# Patient Record
Sex: Male | Born: 1995 | Race: White | Hispanic: No | Marital: Single | Smoking: Never smoker
Health system: Southern US, Community
[De-identification: ages and names within clinical notes are randomized; demographics above are authoritative.]

---

## 2017-07-30 DIAGNOSIS — S0101XA Laceration without foreign body of scalp, initial encounter: Secondary | ICD-10-CM | POA: Diagnosis not present

## 2017-08-09 DIAGNOSIS — Z4802 Encounter for removal of sutures: Secondary | ICD-10-CM | POA: Diagnosis not present

## 2017-12-05 ENCOUNTER — Ambulatory Visit (INDEPENDENT_AMBULATORY_CARE_PROVIDER_SITE_OTHER): Payer: BLUE CROSS/BLUE SHIELD | Admitting: Medical

## 2017-12-05 ENCOUNTER — Encounter: Payer: Self-pay | Admitting: Medical

## 2017-12-05 ENCOUNTER — Ambulatory Visit: Payer: Self-pay

## 2017-12-05 VITALS — BP 135/80 | HR 56 | Temp 98.1°F | Resp 16 | Ht 74.0 in | Wt 168.6 lb

## 2017-12-05 DIAGNOSIS — R42 Dizziness and giddiness: Secondary | ICD-10-CM | POA: Diagnosis not present

## 2017-12-05 MED ORDER — MECLIZINE HCL 12.5 MG PO TABS: 12.5000 mg | ORAL_TABLET | Freq: Three times a day (TID) | ORAL | 0 refills | Status: AC | PRN

## 2017-12-05 MED ORDER — FLUTICASONE PROPIONATE 50 MCG/ACT NA SUSP: 2.0000 | Freq: Every day | NASAL | 1 refills | Status: AC

## 2017-12-05 NOTE — Progress Notes (Signed)
Subjective:    Patient ID: Danny Allen, male    DOB: 10-13-1995, 22 y.o.   MRN: [id number]  HPI Pt in to see me today. New pt but will see Dr. Carmelia Roller on Oct 7,2019.  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  Pt states he recently flew to [place]. He slept very poorly for one day and first day felt really dizzy. Then next 4 days he felt like he needed to almost hold on to things.He mentions that turning his head about one week ago dizziness was worse. Pt states about midway through his trip he was given a nose spray and he thinks this may have helped. Pt feels better than he did at beginning. He slept poorly for about 10 days. About 4-5 hours each night.  Pt does have pressure in his ears.   Pt use spray he got in [place] for 4 hours.    Review of Systems  Constitutional: Negative for chills, fatigue and fever.  HENT: Negative for congestion, dental problem, facial swelling, mouth sores, postnasal drip, sinus pressure and sinus pain.        Ear pressure.  Respiratory: Negative for cough, chest tightness, shortness of breath and wheezing.   Cardiovascular: Negative for chest pain and palpitations.  Gastrointestinal: Negative for abdominal pain.  Genitourinary: Negative for difficulty urinating and dysuria.  Musculoskeletal: Negative for back pain.  Skin: Negative for rash.  Neurological: Positive for dizziness. Negative for speech difficulty, weakness, light-headedness, numbness and headaches.  Hematological: Negative for adenopathy. Does not bruise/bleed easily.  Psychiatric/Behavioral: Negative for agitation, confusion and suicidal ideas. The patient is not nervous/anxious.     No past medical history on file.   Social History   Socioeconomic History  . Marital status: Single    Spouse name: Not on file  . Number of children: Not on file  . Years of education: Not on file  . Highest education level: Not on file    Occupational History  . Not on file  Social Needs  . Financial resource strain: Not on file  . Food insecurity:    Worry: Not on file    Inability: Not on file  . Transportation needs:    Medical: Not on file    Non-medical: Not on file  Tobacco Use  . Smoking status: Never Smoker  . Smokeless tobacco: Never Used  Substance and Sexual Activity  . Alcohol use: Not on file  . Drug use: Not on file  . Sexual activity: Not on file  Lifestyle  . Physical activity:    Days per week: Not on file    Minutes per session: Not on file  . Stress: Not on file  Relationships  . Social connections:    Talks on phone: Not on file    Gets together: Not on file    Attends religious service: Not on file    Active member of club or organization: Not on file    Attends meetings of clubs or organizations: Not on file    Relationship status: Not on file  . Intimate partner violence:    Fear of current or ex partner: Not on file    Emotionally abused: Not on file    Physically abused: Not on file    Forced sexual activity: Not on file  Other Topics Concern  . Not on file  Social History Narrative  . Not on file    No family history on file.  Not on File  No current outpatient medications on file prior to visit.   No current facility-administered medications on file prior to visit.     BP 126/69   Pulse (!) 53   Temp 98.1 F (36.7 C) (Oral)   Resp 16   Ht 6\' 2"  (1.88 m)   Wt 168 lb 9.6 oz (76.5 kg)   SpO2 100%   BMI 21.65 kg/m       Objective:   Physical Exam  General Mental Status- Alert. General Appearance- Not in acute distress.   Skin General: Color- Normal Color. Moisture- Normal Moisture.  Neck Carotid Arteries- Normal color. Moisture- Normal Moisture. No carotid bruits. No JVD.  Chest and Lung Exam Auscultation: Breath Sounds:-Normal.  Cardiovascular Auscultation:Rythm- Regular. Murmurs & Other Heart Sounds:Auscultation of the heart reveals- No  Murmurs.  Abdomen Inspection:-Inspeection Normal. Palpation/Percussion:Note:No mass. Palpation and Percussion of the abdomen reveal- Non Tender, Non Distended + BS, no rebound or guarding.  Neurologic Cranial Nerve exam:- CN III-XII intact(No nystagmus), symmetric smile. Drift Test:- No drift. Romberg Exam:- Negative.  Heal to Toe Gait exam:-Normal. Finger to Nose:- Normal/Intact Strength:- 5/5 equal and symmetric strength both upper and lower extremities. When turns head to the rt feels mild dizzy    HEENT Head- Normal. Ear Auditory Canal - Left- Normal. Right - Normal.Tympanic Membrane- Left- Normal. Right- Normal. Eye Sclera/Conjunctiva- Left- Normal. Right- Normal. Nose & Sinuses Nasal Mucosa- Left-  Boggy and Congested. Right-  Boggy and  Congested.Bilateral no maxillary and no frontal sinus pressure. Mouth & Throat Lips: Upper Lip- Normal: no dryness, cracking, pallor, cyanosis, or vesicular eruption. Lower Lip-Normal: no dryness, cracking, pallor, cyanosis or vesicular eruption. Buccal Mucosa- Bilateral- No Aphthous ulcers. Oropharynx- No Discharge or Erythema. Tonsils: Characteristics- Bilateral- No Erythema or Congestion. Size/Enlargement- Bilateral- No enlargement. Discharge- bilateral-None.      Assessment & Plan:  For your recent dizziness with a recent air travel and some described ear pressure, I prescribed Flonase nasal spray and meclizine.  Rx advisement given.  The nasal spray that they gave you in [place] was probably equivalent of Afrin.  Recommended to stop that to avoid rebound nasal congestion.  On exam you had minimal slight dizziness with turning her head.  If you do start this feel dramatic dizziness or vertigo with head position changes then you could do Epley maneuvers.  You could find instruction video online.  For history of ITP and recent dizziness, I ordered a CBC and metabolic panel today.  If you get dizziness with gross motor or sensory function  deficit type symptoms then recommend ED evaluation.  Follow-up as regular scheduled with Dr. Carmelia Roller.  But if you could my chart me update on how you are doing with above medications by this coming Monday.  Also recommend that you get plenty of sleep and keep yourself hydrated.  Esperanza Richters, PA-C

## 2017-12-05 NOTE — Patient Instructions (Addendum)
For your recent dizziness with a recent air travel and some described ear pressure, I prescribed Flonase nasal spray and meclizine.  Rx advisement given.  The nasal spray that they gave you in [place] was probably equivalent of Afrin.  Recommended to stop that to avoid rebound nasal congestion.  On exam you had minimal slight dizziness with turning her head.  If you do start this feel dramatic dizziness or vertigo with head position changes then you could do Epley maneuvers.  You could find instruction video online.  For history of ITP and recent dizziness, I ordered a CBC and metabolic panel today.  If you get dizziness with gross motor or sensory function deficit type symptoms then recommend ED evaluation.  Follow-up as regular scheduled with Dr. Carmelia Roller.  But if you could my chart me update on how you are doing with above medications by this coming Monday.  Also recommend that you get plenty of sleep and keep yourself hydrated.

## 2017-12-05 NOTE — Telephone Encounter (Signed)
Pt called with C/O dizziness (like standing on a boat) since last Monday 9/16. States he has been traveling with business and his right ear feels funny. He states that when in [place] a pharmacist gave him nose gtts for his dizziness. "It helped some" Pt has seasonal allergies. Pt denies headache, nausea, weakness. Appointment made per protocol. Care advice read to patient Pt verbalized understanding of all.  Reason for Disposition . [1] MILD dizziness (e.g., vertigo; walking normally) AND [2] has NOT been evaluated by physician for this  Answer Assessment - Initial Assessment Questions 1. DESCRIPTION: "Describe your dizziness."     Like standing on a boat 2. VERTIGO: "Do you feel like either you or the room is spinning or tilting?"      tilting 3. LIGHTHEADED: "Do you feel lightheaded?" (e.g., somewhat faint, woozy, weak upon standing)     yes 4. SEVERITY: "How bad is it?"  "Can you walk?"   - MILD - Feels unsteady but walking normally.   - MODERATE - Feels very unsteady when walking, but not falling; interferes with normal activities (e.g., school, work) .   - SEVERE - Unable to walk without falling (requires assistance).     mild 5. ONSET:  "When did the dizziness begin?"     9/16 6. AGGRAVATING FACTORS: "Does anything make it worse?" (e.g., standing, change in head position)     Change in head position and standing 7. CAUSE: "What do you think is causing the dizziness?"     Plane flight strange felling rt ear 8. RECURRENT SYMPTOM: "Have you had dizziness before?" If so, ask: "When was the last time?" "What happened that time?"     no 9. OTHER SYMPTOMS: "Do you have any other symptoms?" (e.g., headache, weakness, numbness, vomiting, earache)     no 10. PREGNANCY: "Is there any chance you are pregnant?" "When was your last menstrual period?"       N/A  Protocols used: DIZZINESS - VERTIGO-A-AH

## 2017-12-06 LAB — COMPREHENSIVE METABOLIC PANEL
ALK PHOS: 52 U/L (ref 39–117)
ALT: 13 U/L (ref 0–53)
AST: 12 U/L (ref 0–37)
Albumin: 5 g/dL (ref 3.5–5.2)
BILIRUBIN TOTAL: 0.7 mg/dL (ref 0.2–1.2)
BUN: 13 mg/dL (ref 6–23)
CO2: 32 meq/L (ref 19–32)
Calcium: 9.8 mg/dL (ref 8.4–10.5)
Chloride: 100 mEq/L (ref 96–112)
Creatinine, Ser: 1.05 mg/dL (ref 0.40–1.50)
GFR: 91.99 mL/min (ref >60.00)
GLUCOSE: 74 mg/dL (ref 70–99)
POTASSIUM: 3.8 meq/L (ref 3.5–5.1)
SODIUM: 140 meq/L (ref 135–145)
TOTAL PROTEIN: 7.1 g/dL (ref 6.0–8.3)

## 2017-12-06 LAB — CBC WITH DIFFERENTIAL/PLATELET
BASOS ABS: 0 10*3/uL (ref 0.0–0.1)
Basophils Relative: 0.5 % (ref 0.0–3.0)
Eosinophils Absolute: 0.3 10*3/uL (ref 0.0–0.7)
Eosinophils Relative: 4.4 % (ref 0.0–5.0)
HCT: 43.7 % (ref 39.0–52.0)
Hemoglobin: 14.7 g/dL (ref 13.0–17.0)
LYMPHS ABS: 3.1 10*3/uL (ref 0.7–4.0)
Lymphocytes Relative: 48.7 % — ABNORMAL HIGH (ref 12.0–46.0)
MCHC: 33.6 g/dL (ref 30.0–36.0)
MCV: 86.7 fl (ref 78.0–100.0)
MONO ABS: 0.5 10*3/uL (ref 0.1–1.0)
Monocytes Relative: 8.2 % (ref 3.0–12.0)
NEUTROS PCT: 38.2 % — AB (ref 43.0–77.0)
Neutro Abs: 2.4 10*3/uL (ref 1.4–7.7)
PLATELETS: 196 10*3/uL (ref 150.0–400.0)
RBC: 5.04 Mil/uL (ref 4.22–5.81)
RDW: 12.9 % (ref 11.5–15.5)
WBC: 6.4 10*3/uL (ref 4.0–10.5)

## 2017-12-16 ENCOUNTER — Ambulatory Visit: Payer: BLUE CROSS/BLUE SHIELD | Admitting: Family Medicine

## 2018-01-01 ENCOUNTER — Telehealth: Payer: Self-pay | Admitting: Medical

## 2018-01-01 NOTE — Telephone Encounter (Signed)
Copied from CRM [id number]. Topic: General - Other >> Jan 01, 2018  4:09 PM Stephannie Li, NT wrote: Reason for CRM: Patient called and said he has the same symptoms he had  after his  flight a month ago  he is still having dizziness off and on and  he  says the same ear feels weird ,he is having the same symptoms from the visit  on  12/05/17 please call him at  [phone]

## 2018-01-01 NOTE — Telephone Encounter (Signed)
Danny Allen- It seems you saw this pt. If you have any recs, please provide input. If he needs appt, Robin please schedule him with me for appt. TY.

## 2018-01-02 DIAGNOSIS — H938X1 Other specified disorders of right ear: Secondary | ICD-10-CM | POA: Diagnosis not present

## 2018-01-02 DIAGNOSIS — R2681 Unsteadiness on feet: Secondary | ICD-10-CM | POA: Diagnosis not present

## 2018-01-02 NOTE — Telephone Encounter (Signed)
Called the patient informed of PCP instructions. He states he has been doing both flonase and meclizine and not helping. He is going to call a ENT for appt. Will call back if needs anything from PCP

## 2018-01-02 NOTE — Telephone Encounter (Signed)
Pt had eustachian tube dysfunction type symptoms after traveling to [place] with some mild vertigo symptoms. He can use flonase over the counter and meclizine. Check and see if he needs refills. But if he is trying both then offer appointment tomorrow. May refer to ENT.

## 2018-05-07 DIAGNOSIS — G43009 Migraine without aura, not intractable, without status migrainosus: Secondary | ICD-10-CM | POA: Diagnosis not present

## 2018-05-07 DIAGNOSIS — H532 Diplopia: Secondary | ICD-10-CM | POA: Diagnosis not present

## 2018-05-07 DIAGNOSIS — R42 Dizziness and giddiness: Secondary | ICD-10-CM | POA: Diagnosis not present

## 2018-05-07 DIAGNOSIS — R51 Headache: Secondary | ICD-10-CM | POA: Diagnosis not present

## 2018-05-18 DIAGNOSIS — R42 Dizziness and giddiness: Secondary | ICD-10-CM | POA: Diagnosis not present

## 2018-05-18 DIAGNOSIS — R51 Headache: Secondary | ICD-10-CM | POA: Diagnosis not present

## 2018-05-19 DIAGNOSIS — R51 Headache: Secondary | ICD-10-CM | POA: Diagnosis not present

## 2018-05-19 DIAGNOSIS — R42 Dizziness and giddiness: Secondary | ICD-10-CM | POA: Diagnosis not present

## 2018-05-22 DIAGNOSIS — G43009 Migraine without aura, not intractable, without status migrainosus: Secondary | ICD-10-CM | POA: Diagnosis not present

## 2018-05-22 DIAGNOSIS — S0990XD Unspecified injury of head, subsequent encounter: Secondary | ICD-10-CM | POA: Diagnosis not present

## 2018-05-22 DIAGNOSIS — H538 Other visual disturbances: Secondary | ICD-10-CM | POA: Diagnosis not present

## 2018-05-22 DIAGNOSIS — R42 Dizziness and giddiness: Secondary | ICD-10-CM | POA: Diagnosis not present

## 2018-06-26 DIAGNOSIS — H532 Diplopia: Secondary | ICD-10-CM | POA: Diagnosis not present

## 2018-06-26 DIAGNOSIS — G43009 Migraine without aura, not intractable, without status migrainosus: Secondary | ICD-10-CM | POA: Diagnosis not present

## 2018-06-26 DIAGNOSIS — R51 Headache: Secondary | ICD-10-CM | POA: Diagnosis not present

## 2018-06-26 DIAGNOSIS — R42 Dizziness and giddiness: Secondary | ICD-10-CM | POA: Diagnosis not present

## 2018-08-01 DIAGNOSIS — M9901 Segmental and somatic dysfunction of cervical region: Secondary | ICD-10-CM | POA: Diagnosis not present

## 2018-08-01 DIAGNOSIS — M5413 Radiculopathy, cervicothoracic region: Secondary | ICD-10-CM | POA: Diagnosis not present

## 2018-08-01 DIAGNOSIS — R42 Dizziness and giddiness: Secondary | ICD-10-CM | POA: Diagnosis not present

## 2018-08-01 DIAGNOSIS — G44209 Tension-type headache, unspecified, not intractable: Secondary | ICD-10-CM | POA: Diagnosis not present

## 2018-08-08 DIAGNOSIS — M9901 Segmental and somatic dysfunction of cervical region: Secondary | ICD-10-CM | POA: Diagnosis not present

## 2018-08-08 DIAGNOSIS — M5413 Radiculopathy, cervicothoracic region: Secondary | ICD-10-CM | POA: Diagnosis not present

## 2018-08-08 DIAGNOSIS — G44209 Tension-type headache, unspecified, not intractable: Secondary | ICD-10-CM | POA: Diagnosis not present

## 2018-08-08 DIAGNOSIS — R42 Dizziness and giddiness: Secondary | ICD-10-CM | POA: Diagnosis not present

## 2018-08-15 DIAGNOSIS — R42 Dizziness and giddiness: Secondary | ICD-10-CM | POA: Diagnosis not present

## 2018-08-15 DIAGNOSIS — M9901 Segmental and somatic dysfunction of cervical region: Secondary | ICD-10-CM | POA: Diagnosis not present

## 2018-08-15 DIAGNOSIS — M5413 Radiculopathy, cervicothoracic region: Secondary | ICD-10-CM | POA: Diagnosis not present

## 2018-08-15 DIAGNOSIS — G44209 Tension-type headache, unspecified, not intractable: Secondary | ICD-10-CM | POA: Diagnosis not present

## 2018-08-22 DIAGNOSIS — M5413 Radiculopathy, cervicothoracic region: Secondary | ICD-10-CM | POA: Diagnosis not present

## 2018-08-22 DIAGNOSIS — M9901 Segmental and somatic dysfunction of cervical region: Secondary | ICD-10-CM | POA: Diagnosis not present

## 2018-08-22 DIAGNOSIS — R42 Dizziness and giddiness: Secondary | ICD-10-CM | POA: Diagnosis not present

## 2018-08-22 DIAGNOSIS — G44209 Tension-type headache, unspecified, not intractable: Secondary | ICD-10-CM | POA: Diagnosis not present

## 2018-08-25 DIAGNOSIS — R42 Dizziness and giddiness: Secondary | ICD-10-CM | POA: Diagnosis not present

## 2018-08-25 DIAGNOSIS — G44209 Tension-type headache, unspecified, not intractable: Secondary | ICD-10-CM | POA: Diagnosis not present

## 2018-08-25 DIAGNOSIS — M9901 Segmental and somatic dysfunction of cervical region: Secondary | ICD-10-CM | POA: Diagnosis not present

## 2018-08-25 DIAGNOSIS — M5413 Radiculopathy, cervicothoracic region: Secondary | ICD-10-CM | POA: Diagnosis not present

## 2018-08-27 DIAGNOSIS — M5413 Radiculopathy, cervicothoracic region: Secondary | ICD-10-CM | POA: Diagnosis not present

## 2018-08-27 DIAGNOSIS — M9901 Segmental and somatic dysfunction of cervical region: Secondary | ICD-10-CM | POA: Diagnosis not present

## 2018-08-27 DIAGNOSIS — G44209 Tension-type headache, unspecified, not intractable: Secondary | ICD-10-CM | POA: Diagnosis not present

## 2018-08-27 DIAGNOSIS — R42 Dizziness and giddiness: Secondary | ICD-10-CM | POA: Diagnosis not present

## 2018-09-05 DIAGNOSIS — G541 Lumbosacral plexus disorders: Secondary | ICD-10-CM | POA: Diagnosis not present

## 2018-09-05 DIAGNOSIS — G540 Brachial plexus disorders: Secondary | ICD-10-CM | POA: Diagnosis not present

## 2018-09-05 DIAGNOSIS — M40292 Other kyphosis, cervical region: Secondary | ICD-10-CM | POA: Diagnosis not present

## 2018-09-05 DIAGNOSIS — R42 Dizziness and giddiness: Secondary | ICD-10-CM | POA: Diagnosis not present

## 2018-09-08 DIAGNOSIS — R42 Dizziness and giddiness: Secondary | ICD-10-CM | POA: Diagnosis not present

## 2018-09-08 DIAGNOSIS — G540 Brachial plexus disorders: Secondary | ICD-10-CM | POA: Diagnosis not present

## 2018-09-08 DIAGNOSIS — M40292 Other kyphosis, cervical region: Secondary | ICD-10-CM | POA: Diagnosis not present

## 2018-09-08 DIAGNOSIS — G541 Lumbosacral plexus disorders: Secondary | ICD-10-CM | POA: Diagnosis not present

## 2018-09-10 DIAGNOSIS — R42 Dizziness and giddiness: Secondary | ICD-10-CM | POA: Diagnosis not present

## 2018-09-10 DIAGNOSIS — G540 Brachial plexus disorders: Secondary | ICD-10-CM | POA: Diagnosis not present

## 2018-09-10 DIAGNOSIS — G541 Lumbosacral plexus disorders: Secondary | ICD-10-CM | POA: Diagnosis not present

## 2018-09-10 DIAGNOSIS — M40292 Other kyphosis, cervical region: Secondary | ICD-10-CM | POA: Diagnosis not present

## 2018-09-12 DIAGNOSIS — G540 Brachial plexus disorders: Secondary | ICD-10-CM | POA: Diagnosis not present

## 2018-09-12 DIAGNOSIS — G541 Lumbosacral plexus disorders: Secondary | ICD-10-CM | POA: Diagnosis not present

## 2018-09-12 DIAGNOSIS — R42 Dizziness and giddiness: Secondary | ICD-10-CM | POA: Diagnosis not present

## 2018-09-12 DIAGNOSIS — M40292 Other kyphosis, cervical region: Secondary | ICD-10-CM | POA: Diagnosis not present

## 2018-09-15 DIAGNOSIS — G541 Lumbosacral plexus disorders: Secondary | ICD-10-CM | POA: Diagnosis not present

## 2018-09-15 DIAGNOSIS — M40292 Other kyphosis, cervical region: Secondary | ICD-10-CM | POA: Diagnosis not present

## 2018-09-15 DIAGNOSIS — G540 Brachial plexus disorders: Secondary | ICD-10-CM | POA: Diagnosis not present

## 2018-09-15 DIAGNOSIS — R42 Dizziness and giddiness: Secondary | ICD-10-CM | POA: Diagnosis not present

## 2018-09-17 DIAGNOSIS — G541 Lumbosacral plexus disorders: Secondary | ICD-10-CM | POA: Diagnosis not present

## 2018-09-17 DIAGNOSIS — M40292 Other kyphosis, cervical region: Secondary | ICD-10-CM | POA: Diagnosis not present

## 2018-09-17 DIAGNOSIS — R42 Dizziness and giddiness: Secondary | ICD-10-CM | POA: Diagnosis not present

## 2018-09-17 DIAGNOSIS — G540 Brachial plexus disorders: Secondary | ICD-10-CM | POA: Diagnosis not present

## 2018-09-19 DIAGNOSIS — G541 Lumbosacral plexus disorders: Secondary | ICD-10-CM | POA: Diagnosis not present

## 2018-09-19 DIAGNOSIS — M40292 Other kyphosis, cervical region: Secondary | ICD-10-CM | POA: Diagnosis not present

## 2018-09-19 DIAGNOSIS — R42 Dizziness and giddiness: Secondary | ICD-10-CM | POA: Diagnosis not present

## 2018-09-19 DIAGNOSIS — G540 Brachial plexus disorders: Secondary | ICD-10-CM | POA: Diagnosis not present

## 2018-09-22 DIAGNOSIS — R42 Dizziness and giddiness: Secondary | ICD-10-CM | POA: Diagnosis not present

## 2018-09-22 DIAGNOSIS — G541 Lumbosacral plexus disorders: Secondary | ICD-10-CM | POA: Diagnosis not present

## 2018-09-22 DIAGNOSIS — G540 Brachial plexus disorders: Secondary | ICD-10-CM | POA: Diagnosis not present

## 2018-09-22 DIAGNOSIS — M40292 Other kyphosis, cervical region: Secondary | ICD-10-CM | POA: Diagnosis not present

## 2018-09-24 DIAGNOSIS — M40292 Other kyphosis, cervical region: Secondary | ICD-10-CM | POA: Diagnosis not present

## 2018-09-24 DIAGNOSIS — G541 Lumbosacral plexus disorders: Secondary | ICD-10-CM | POA: Diagnosis not present

## 2018-09-24 DIAGNOSIS — R42 Dizziness and giddiness: Secondary | ICD-10-CM | POA: Diagnosis not present

## 2018-09-24 DIAGNOSIS — G540 Brachial plexus disorders: Secondary | ICD-10-CM | POA: Diagnosis not present

## 2018-09-25 DIAGNOSIS — R42 Dizziness and giddiness: Secondary | ICD-10-CM | POA: Diagnosis not present

## 2018-09-25 DIAGNOSIS — G43009 Migraine without aura, not intractable, without status migrainosus: Secondary | ICD-10-CM | POA: Diagnosis not present

## 2018-09-25 DIAGNOSIS — R200 Anesthesia of skin: Secondary | ICD-10-CM | POA: Diagnosis not present

## 2018-09-25 DIAGNOSIS — R202 Paresthesia of skin: Secondary | ICD-10-CM | POA: Diagnosis not present

## 2018-10-02 DIAGNOSIS — R42 Dizziness and giddiness: Secondary | ICD-10-CM | POA: Diagnosis not present

## 2018-10-02 DIAGNOSIS — G4489 Other headache syndrome: Secondary | ICD-10-CM | POA: Diagnosis not present

## 2018-10-06 DIAGNOSIS — R42 Dizziness and giddiness: Secondary | ICD-10-CM | POA: Diagnosis not present

## 2018-10-06 DIAGNOSIS — G4489 Other headache syndrome: Secondary | ICD-10-CM | POA: Diagnosis not present

## 2018-10-13 DIAGNOSIS — R42 Dizziness and giddiness: Secondary | ICD-10-CM | POA: Diagnosis not present

## 2018-10-13 DIAGNOSIS — G4489 Other headache syndrome: Secondary | ICD-10-CM | POA: Diagnosis not present

## 2018-10-20 DIAGNOSIS — G4489 Other headache syndrome: Secondary | ICD-10-CM | POA: Diagnosis not present

## 2018-10-20 DIAGNOSIS — R42 Dizziness and giddiness: Secondary | ICD-10-CM | POA: Diagnosis not present

## 2018-10-27 ENCOUNTER — Other Ambulatory Visit: Payer: Self-pay | Admitting: Dermatology

## 2018-10-27 ENCOUNTER — Other Ambulatory Visit: Payer: Self-pay | Admitting: Physical Medicine and Rehabilitation

## 2018-10-27 DIAGNOSIS — M542 Cervicalgia: Secondary | ICD-10-CM

## 2018-10-27 DIAGNOSIS — R2990 Unspecified symptoms and signs involving the nervous system: Secondary | ICD-10-CM

## 2018-10-27 DIAGNOSIS — R42 Dizziness and giddiness: Secondary | ICD-10-CM | POA: Diagnosis not present

## 2018-10-27 DIAGNOSIS — G4489 Other headache syndrome: Secondary | ICD-10-CM | POA: Diagnosis not present

## 2018-10-28 ENCOUNTER — Other Ambulatory Visit: Payer: Self-pay | Admitting: Physical Medicine and Rehabilitation

## 2018-10-29 DIAGNOSIS — G4489 Other headache syndrome: Secondary | ICD-10-CM | POA: Diagnosis not present

## 2018-10-29 DIAGNOSIS — R42 Dizziness and giddiness: Secondary | ICD-10-CM | POA: Diagnosis not present

## 2018-11-03 DIAGNOSIS — R51 Headache: Secondary | ICD-10-CM | POA: Diagnosis not present

## 2018-11-03 DIAGNOSIS — G43009 Migraine without aura, not intractable, without status migrainosus: Secondary | ICD-10-CM | POA: Diagnosis not present

## 2018-11-03 DIAGNOSIS — R200 Anesthesia of skin: Secondary | ICD-10-CM | POA: Diagnosis not present

## 2018-11-03 DIAGNOSIS — R42 Dizziness and giddiness: Secondary | ICD-10-CM | POA: Diagnosis not present

## 2018-11-04 DIAGNOSIS — R42 Dizziness and giddiness: Secondary | ICD-10-CM | POA: Diagnosis not present

## 2018-11-04 DIAGNOSIS — G4489 Other headache syndrome: Secondary | ICD-10-CM | POA: Diagnosis not present

## 2018-11-07 DIAGNOSIS — R7301 Impaired fasting glucose: Secondary | ICD-10-CM | POA: Diagnosis not present

## 2018-11-07 DIAGNOSIS — R42 Dizziness and giddiness: Secondary | ICD-10-CM | POA: Diagnosis not present

## 2018-11-07 DIAGNOSIS — R5383 Other fatigue: Secondary | ICD-10-CM | POA: Diagnosis not present

## 2018-11-07 DIAGNOSIS — R7989 Other specified abnormal findings of blood chemistry: Secondary | ICD-10-CM | POA: Diagnosis not present

## 2018-11-10 DIAGNOSIS — G4489 Other headache syndrome: Secondary | ICD-10-CM | POA: Diagnosis not present

## 2018-11-10 DIAGNOSIS — R42 Dizziness and giddiness: Secondary | ICD-10-CM | POA: Diagnosis not present

## 2018-11-20 DIAGNOSIS — G4489 Other headache syndrome: Secondary | ICD-10-CM | POA: Diagnosis not present

## 2018-11-20 DIAGNOSIS — R42 Dizziness and giddiness: Secondary | ICD-10-CM | POA: Diagnosis not present

## 2018-11-24 DIAGNOSIS — R42 Dizziness and giddiness: Secondary | ICD-10-CM | POA: Diagnosis not present

## 2018-11-24 DIAGNOSIS — G4489 Other headache syndrome: Secondary | ICD-10-CM | POA: Diagnosis not present

## 2018-11-25 ENCOUNTER — Other Ambulatory Visit: Payer: Self-pay

## 2018-11-25 ENCOUNTER — Ambulatory Visit
Admission: RE | Admit: 2018-11-25 | Discharge: 2018-11-25 | Disposition: A | Discharge status: Home / Self Care | Payer: BC Managed Care – PPO | Source: Ambulatory Visit | Attending: Physical Medicine and Rehabilitation | Admitting: Physical Medicine and Rehabilitation

## 2018-11-25 DIAGNOSIS — R2990 Unspecified symptoms and signs involving the nervous system: Secondary | ICD-10-CM

## 2018-11-25 DIAGNOSIS — M542 Cervicalgia: Secondary | ICD-10-CM | POA: Diagnosis not present

## 2018-11-27 DIAGNOSIS — R42 Dizziness and giddiness: Secondary | ICD-10-CM | POA: Diagnosis not present

## 2018-11-27 DIAGNOSIS — G4489 Other headache syndrome: Secondary | ICD-10-CM | POA: Diagnosis not present

## 2018-11-28 DIAGNOSIS — R55 Syncope and collapse: Secondary | ICD-10-CM | POA: Diagnosis not present

## 2018-11-28 DIAGNOSIS — R002 Palpitations: Secondary | ICD-10-CM | POA: Diagnosis not present

## 2018-12-02 DIAGNOSIS — R002 Palpitations: Secondary | ICD-10-CM | POA: Diagnosis not present

## 2018-12-04 DIAGNOSIS — G4489 Other headache syndrome: Secondary | ICD-10-CM | POA: Diagnosis not present

## 2018-12-04 DIAGNOSIS — R42 Dizziness and giddiness: Secondary | ICD-10-CM | POA: Diagnosis not present

## 2018-12-06 DIAGNOSIS — Z202 Contact with and (suspected) exposure to infections with a predominantly sexual mode of transmission: Secondary | ICD-10-CM | POA: Diagnosis not present

## 2018-12-18 DIAGNOSIS — R42 Dizziness and giddiness: Secondary | ICD-10-CM | POA: Diagnosis not present

## 2018-12-18 DIAGNOSIS — G4489 Other headache syndrome: Secondary | ICD-10-CM | POA: Diagnosis not present

## 2018-12-22 DIAGNOSIS — R002 Palpitations: Secondary | ICD-10-CM | POA: Diagnosis not present

## 2018-12-23 DIAGNOSIS — G43009 Migraine without aura, not intractable, without status migrainosus: Secondary | ICD-10-CM | POA: Diagnosis not present

## 2018-12-23 DIAGNOSIS — R2681 Unsteadiness on feet: Secondary | ICD-10-CM | POA: Diagnosis not present

## 2018-12-23 DIAGNOSIS — R42 Dizziness and giddiness: Secondary | ICD-10-CM | POA: Diagnosis not present

## 2018-12-23 DIAGNOSIS — M542 Cervicalgia: Secondary | ICD-10-CM | POA: Diagnosis not present

## 2018-12-24 DIAGNOSIS — G4489 Other headache syndrome: Secondary | ICD-10-CM | POA: Diagnosis not present

## 2018-12-24 DIAGNOSIS — R42 Dizziness and giddiness: Secondary | ICD-10-CM | POA: Diagnosis not present

## 2019-01-07 DIAGNOSIS — Z713 Dietary counseling and surveillance: Secondary | ICD-10-CM | POA: Diagnosis not present

## 2019-01-13 DIAGNOSIS — R002 Palpitations: Secondary | ICD-10-CM | POA: Diagnosis not present

## 2019-01-13 DIAGNOSIS — R55 Syncope and collapse: Secondary | ICD-10-CM | POA: Diagnosis not present

## 2019-01-28 DIAGNOSIS — Z713 Dietary counseling and surveillance: Secondary | ICD-10-CM | POA: Diagnosis not present

## 2019-02-16 DIAGNOSIS — H02884 Meibomian gland dysfunction left upper eyelid: Secondary | ICD-10-CM | POA: Diagnosis not present

## 2019-02-16 DIAGNOSIS — H40003 Preglaucoma, unspecified, bilateral: Secondary | ICD-10-CM | POA: Diagnosis not present

## 2019-02-16 DIAGNOSIS — H02883 Meibomian gland dysfunction of right eye, unspecified eyelid: Secondary | ICD-10-CM | POA: Diagnosis not present

## 2019-02-24 DIAGNOSIS — R42 Dizziness and giddiness: Secondary | ICD-10-CM | POA: Diagnosis not present

## 2019-03-02 DIAGNOSIS — R55 Syncope and collapse: Secondary | ICD-10-CM | POA: Diagnosis not present

## 2019-03-02 DIAGNOSIS — R2689 Other abnormalities of gait and mobility: Secondary | ICD-10-CM | POA: Diagnosis not present

## 2019-03-02 DIAGNOSIS — R42 Dizziness and giddiness: Secondary | ICD-10-CM | POA: Diagnosis not present

## 2019-05-28 ENCOUNTER — Ambulatory Visit: Payer: BC Managed Care – PPO | Attending: Internal Medicine

## 2019-05-28 DIAGNOSIS — Z23 Encounter for immunization: Secondary | ICD-10-CM

## 2019-05-28 NOTE — Progress Notes (Signed)
   Covid-19 Vaccination Clinic  Name:  Danny Allen    MRN: [id number] DOB: 14-May-1995  05/28/2019  [name] was observed post Covid-19 immunization for 15 minutes without incident. He was provided with Vaccine Information Sheet and instruction to access the V-Safe system.   [name] was instructed to call 911 with any severe reactions post vaccine: Marland Kitchen Difficulty breathing  . Swelling of face and throat  . A fast heartbeat  . A bad rash all over body  . Dizziness and weakness   Immunizations Administered    Name Date Dose VIS Date Route   Pfizer COVID-19 Vaccine 05/28/2019  9:16 AM 0.3 mL 02/20/2019 Intramuscular   Manufacturer: [employer], [employer]   Lot: [id number]   NDC: [address]-8

## 2019-06-22 ENCOUNTER — Ambulatory Visit: Payer: BC Managed Care – PPO | Attending: Internal Medicine

## 2019-06-22 DIAGNOSIS — Z23 Encounter for immunization: Secondary | ICD-10-CM

## 2019-06-22 NOTE — Progress Notes (Signed)
   Covid-19 Vaccination Clinic  Name:  Maximum Reiland    MRN: [id number] DOB: 1995/03/29  06/22/2019  Mr. Laskowski was observed post Covid-19 immunization for 15 minutes without incident. He was provided with Vaccine Information Sheet and instruction to access the V-Safe system.   Mr. Beasley was instructed to call 911 with any severe reactions post vaccine: Marland Kitchen Difficulty breathing  . Swelling of face and throat  . A fast heartbeat  . A bad rash all over body  . Dizziness and weakness   Immunizations Administered    Name Date Dose VIS Date Route   Pfizer COVID-19 Vaccine 06/22/2019  4:33 PM 0.3 mL 02/20/2019 Intramuscular   Manufacturer: [employer], [employer]   Lot: [id number]   NDC: [address]-4

## 2020-01-29 IMAGING — MR MR CERVICAL SPINE W/O CM
5 series · 42 of 48 positions shown · non-contrast
Comparison: None.

CLINICAL DATA: Cervical pain and neurologic symptoms

EXAM:
MRI CERVICAL SPINE WITHOUT CONTRAST
TECHNIQUE: Multiplanar, multisequence MR imaging of the cervical spine was
performed. No intravenous contrast was administered.

[Series 6: T1 · sagittal · 1.5mm · 0.98mm/px · 8 of 47 slices shown]
[im 1/47]
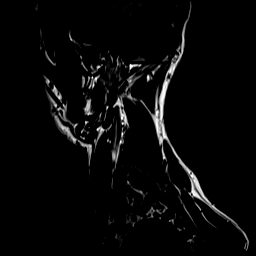
[im 7/47]
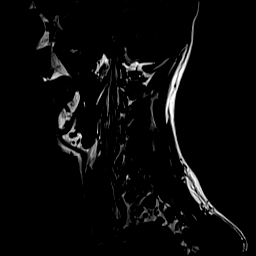
[im 14/47]
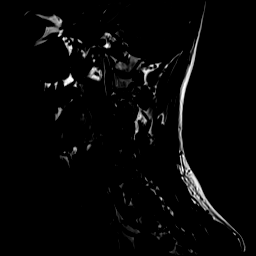
[im 20/47]
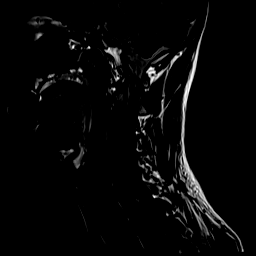
[im 27/47]
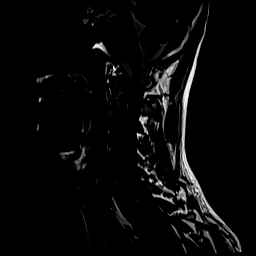
[im 33/47]
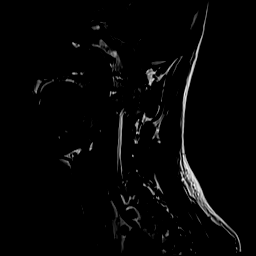
[im 40/47]
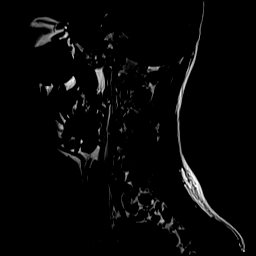
[im 47/47]
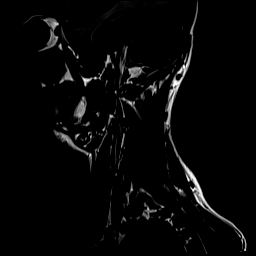

[Series 7: T2 · sagittal · 1.5mm · 0.98mm/px · 7 of 47 slices shown (1 of 2)]
[im 1/47]
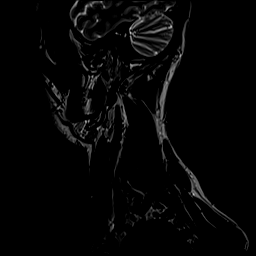
[im 8/47]
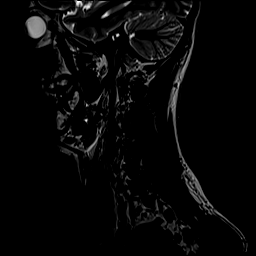
[im 16/47]
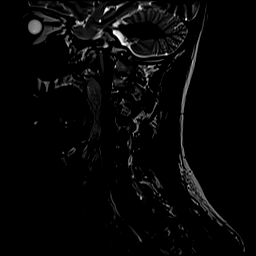
[im 24/47]
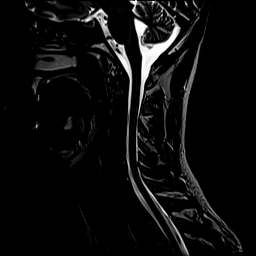
[im 31/47]
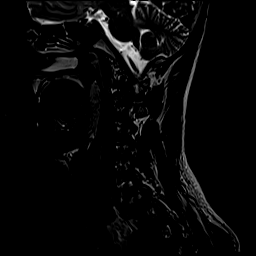
[im 39/47]
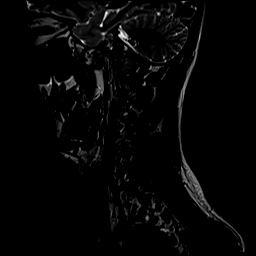
[im 47/47]
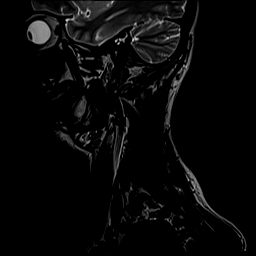

[Series 8: STIR · sagittal · 1.5mm · 0.49mm/px · 7 of 47 slices shown]
[im 1/47]
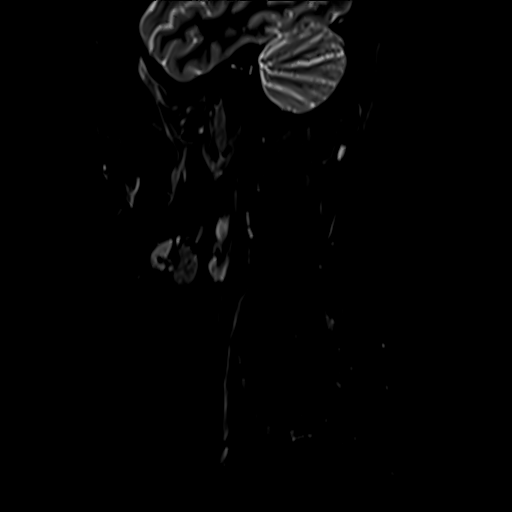
[im 8/47]
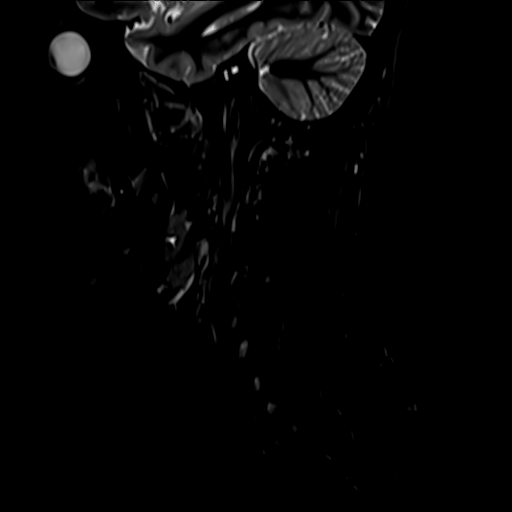
[im 16/47]
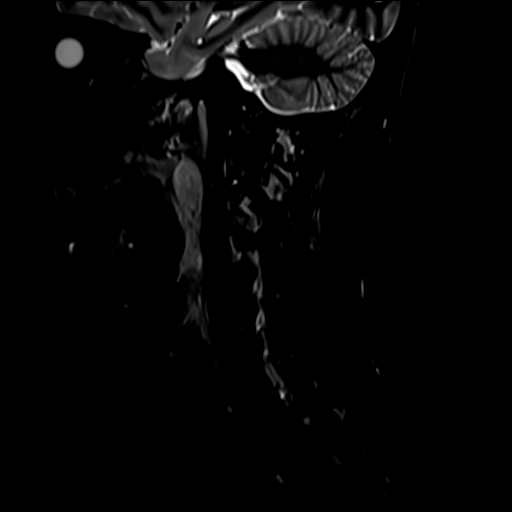
[im 24/47]
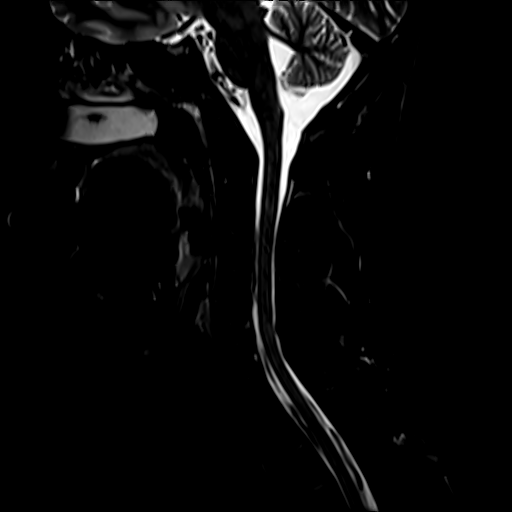
[im 31/47]
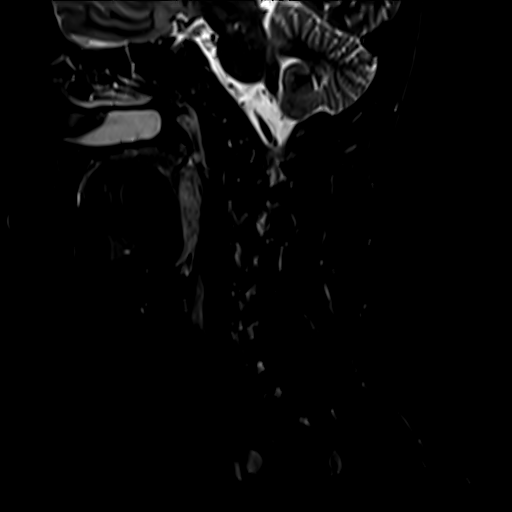
[im 39/47]
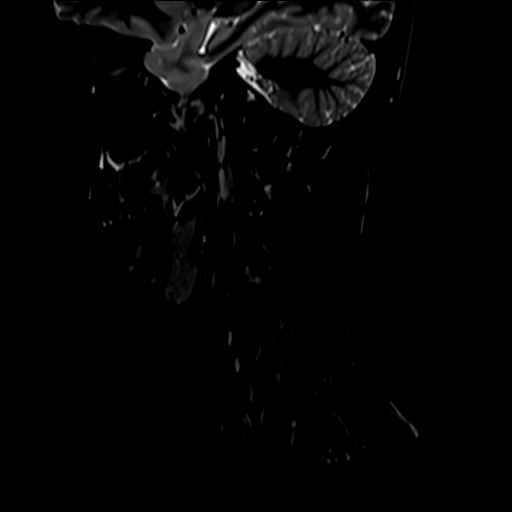
[im 47/47]
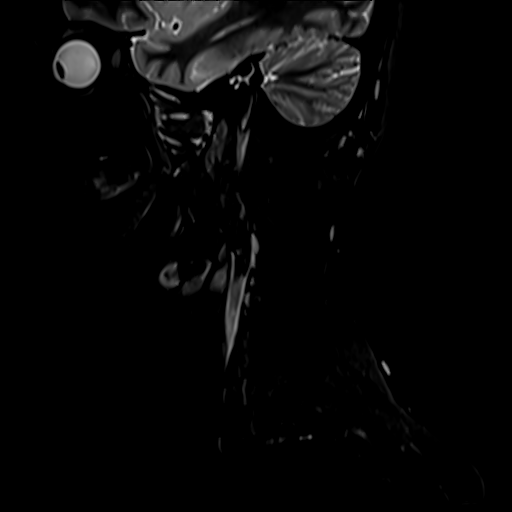

[Series 9: T2 · axial · 2.0mm · 0.62mm/px · z∈[-58,+122]mm · 12 of 81 slices shown (2 of 2)]
[im 1/81]
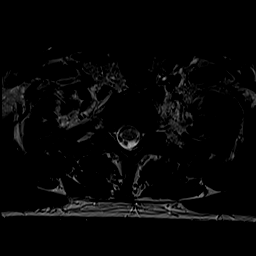
[im 7/81]
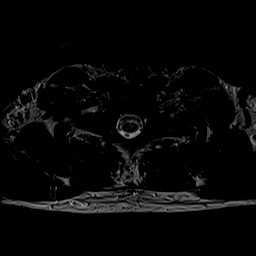
[im 14/81]
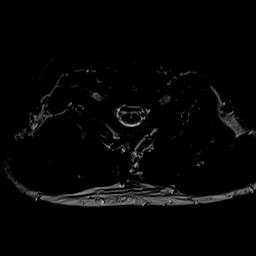
[im 21/81]
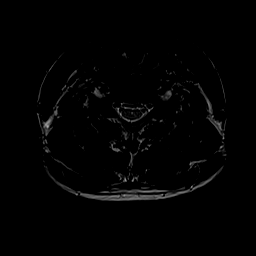
[im 27/81]
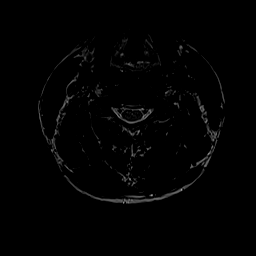
[im 34/81]
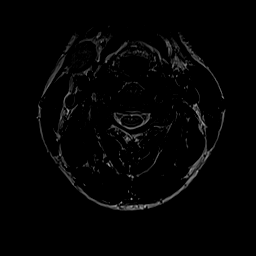
[im 41/81]
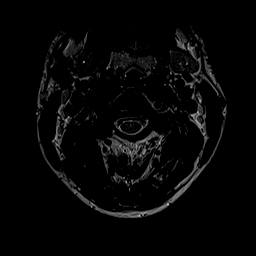
[im 47/81]
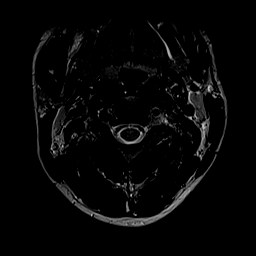
[im 54/81]
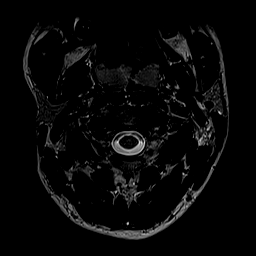
[im 61/81]
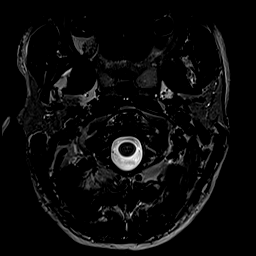
[im 67/81]
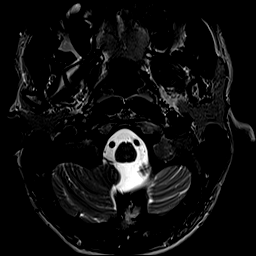
[im 81/81]
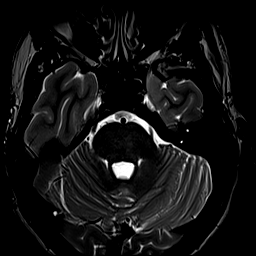

[Series 10: GRE · axial · 2.0mm · 0.42mm/px · z∈[-58,+122]mm · 8 of 81 slices shown]
[im 1/81]
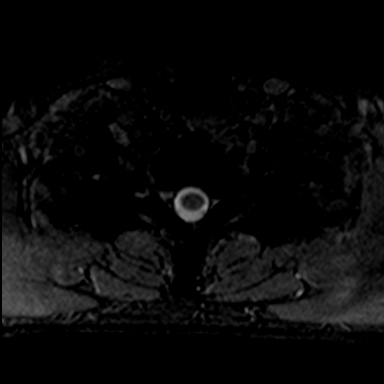
[im 14/81]
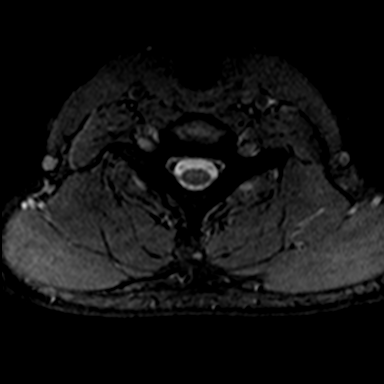
[im 27/81]
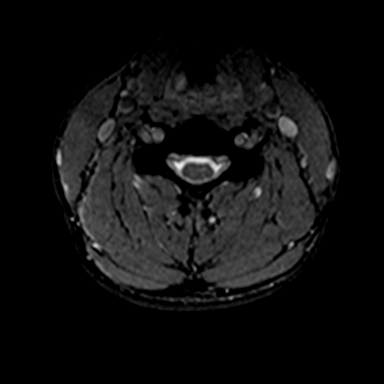
[im 34/81]
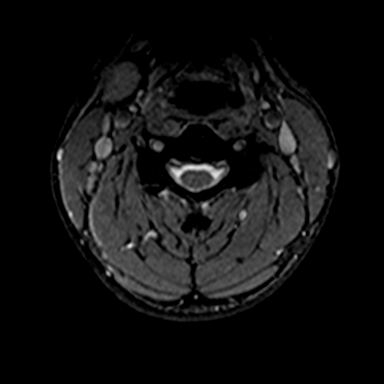
[im 47/81]
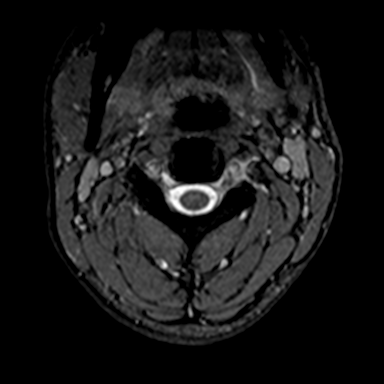
[im 54/81]
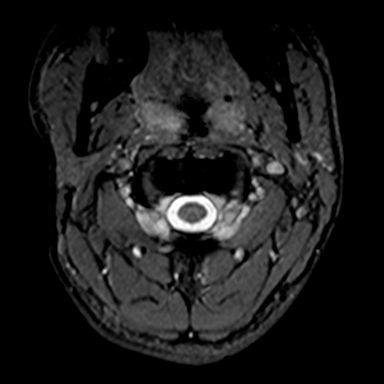
[im 67/81]
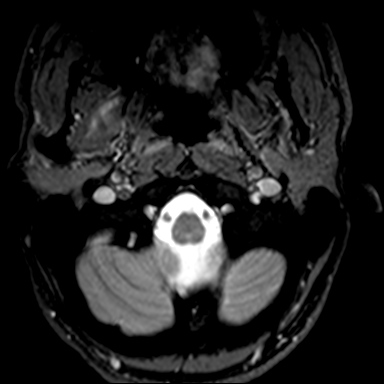
[im 81/81]
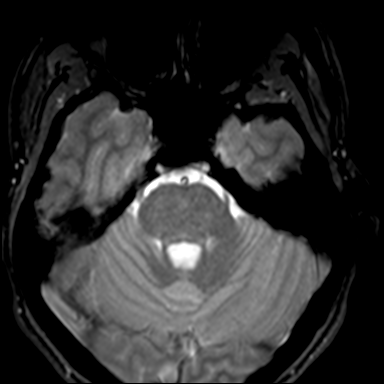

[42 of 48 positions shown; findings below may reference images not displayed]

FINDINGS: Alignment: Normal

Vertebrae: No fracture, evidence of discitis, or bone lesion.

Cord: Normal signal and morphology.

Posterior Fossa, vertebral arteries, paraspinal tissues: No evidence
of mass or inflammation. Hypertrophic tonsils.

Disc levels:

No degenerative changes or impingement.
IMPRESSION: Negative cervical MRI.
